# Patient Record
Sex: Female | Born: 1955 | Race: White | Hispanic: No | Marital: Married | Smoking: Never smoker
Health system: Southern US, Community
[De-identification: ages and names within clinical notes are randomized; demographics above are authoritative.]

## PROBLEM LIST (undated history)

## (undated) DIAGNOSIS — E079 Disorder of thyroid, unspecified: Secondary | ICD-10-CM

## (undated) DIAGNOSIS — I10 Essential (primary) hypertension: Secondary | ICD-10-CM

## (undated) HISTORY — PX: THYROID SURGERY: SHX805

## (undated) HISTORY — DX: Disorder of thyroid, unspecified: E07.9

## (undated) HISTORY — DX: Essential (primary) hypertension: I10

---

## 1999-10-03 ENCOUNTER — Encounter: Admission: RE | Admit: 1999-10-03 | Discharge: 1999-10-03 | Payer: Self-pay | Admitting: Obstetrics and Gynecology

## 1999-10-03 ENCOUNTER — Encounter: Payer: Self-pay | Admitting: Obstetrics and Gynecology

## 2003-04-14 ENCOUNTER — Encounter: Admission: RE | Admit: 2003-04-14 | Discharge: 2003-04-14 | Payer: Self-pay | Admitting: Family Medicine

## 2003-04-14 ENCOUNTER — Ambulatory Visit (HOSPITAL_COMMUNITY): Admission: RE | Admit: 2003-04-14 | Discharge: 2003-04-14 | Payer: Self-pay | Admitting: Gastroenterology

## 2010-01-31 ENCOUNTER — Encounter (HOSPITAL_COMMUNITY)
Admission: RE | Admit: 2010-01-31 | Discharge: 2010-03-30 | Payer: Self-pay | Source: Home / Self Care | Attending: Internal Medicine | Admitting: Internal Medicine

## 2010-08-16 NOTE — Op Note (Signed)
Mary Bullock, Mary Bullock                       ACCOUNT NO.:  [id number]   MEDICAL RECORD NO.:  [id number]                   PATIENT TYPE:  AMB   LOCATION:  ENDO                                 FACILITY:  [facility]   PHYSICIAN:  James L. Malon Kindle., M.D.          DATE OF BIRTH:  1955-05-27   DATE OF PROCEDURE:  04/14/2003  DATE OF DISCHARGE:                                 OPERATIVE REPORT   PROCEDURE:  Esophagogastroduodenoscopy and removal of meat impaction.   MEDICATIONS:  Fentanyl 100 mcg, Versed 10 mg IV.   INDICATIONS FOR PROCEDURE:  The patient was eating steak last night and has  been unable to swallow since. A barium swallow done as an outpatient showed  complete obstruction of the esophagus.  The procedure is done to remove the  presumed meat impaction.  It was discussed in detail with the patient prior  to the procedure including the risks and benefits, possibility of  perforation, fever, aspiration, etc.   DESCRIPTION OF PROCEDURE:  The procedure had been explained to the patient  and consent obtained.  With the patient in the left lateral decubitus  position, the Olympus scope was inserted.  A large amount of saliva and  barium was seen in the esophagus.  This was sucked out.  She had a large  meat impaction wedged in the distal esophagus.  Multiple maneuvers were  tried, it had been in there overnight and it was partially decomposed and  kept breaking off.  After failing with a basket and tripod, the only thing  that really would work was to break this apart piece by piece with the  tripod and then use the polyp retrieval basket to collect these pieces and  remove them. This was done 7 or 8 times.  At this point, we whittled  approximately half of the impaction away and were able to get the tripod  around the rest and reach solid material that would accept the graspers. We  were able to pull it out.  The scope was then reinserted and advanced. The  distal esophagus was  examined. There really was very little evidence of  friability, no gross stricture.  The scope easily passed in the stomach, a  complete endoscopy was performed. The duodenal bulb and pyloric channel were  normal. The fundus and cardia were seen well and were normal. No gross  evidence of esophageal stricture, tumor, etc.  There were no more meat  fragments left upon removing the scope.  The patient tolerated the procedure  reasonably well and was awake and alert at the termination of the procedure  with no complaints of shortness of breath, chest pain, etc.   ASSESSMENT:  Meat impaction of the distal esophagus removed.   PLAN:  Will keep the patient on clear liquids today. Will call for pain,  fever, continued coughing, etc.  Tomorrow she can eat what she wants.  James L. Malon Kindle., M.D.   Waldron Session  D:  04/14/2003  T:  04/14/2003  Job:  [id number]   cc:   Caryn Bee L. Little, M.D.  [address]  [place]  [place] [address]  Fax: [phone]

## 2010-08-19 ENCOUNTER — Other Ambulatory Visit: Payer: Self-pay | Admitting: Family Medicine

## 2010-08-19 DIAGNOSIS — Z1231 Encounter for screening mammogram for malignant neoplasm of breast: Secondary | ICD-10-CM

## 2010-08-28 ENCOUNTER — Ambulatory Visit
Admission: RE | Admit: 2010-08-28 | Discharge: 2010-08-28 | Disposition: A | Discharge status: Home / Self Care | Payer: BC Managed Care – PPO | Source: Ambulatory Visit | Attending: Family Medicine | Admitting: Family Medicine

## 2010-08-28 ENCOUNTER — Other Ambulatory Visit: Payer: Self-pay | Admitting: Family Medicine

## 2010-08-28 DIAGNOSIS — Z1231 Encounter for screening mammogram for malignant neoplasm of breast: Secondary | ICD-10-CM

## 2010-08-30 ENCOUNTER — Other Ambulatory Visit: Payer: Self-pay | Admitting: Family Medicine

## 2010-08-30 DIAGNOSIS — R928 Other abnormal and inconclusive findings on diagnostic imaging of breast: Secondary | ICD-10-CM

## 2010-09-05 ENCOUNTER — Ambulatory Visit
Admission: RE | Admit: 2010-09-05 | Discharge: 2010-09-05 | Disposition: A | Discharge status: Home / Self Care | Payer: BC Managed Care – PPO | Source: Ambulatory Visit | Attending: Family Medicine | Admitting: Family Medicine

## 2010-09-05 DIAGNOSIS — R928 Other abnormal and inconclusive findings on diagnostic imaging of breast: Secondary | ICD-10-CM

## 2011-01-29 ENCOUNTER — Other Ambulatory Visit: Payer: Self-pay | Admitting: Family Medicine

## 2011-01-29 DIAGNOSIS — R921 Mammographic calcification found on diagnostic imaging of breast: Secondary | ICD-10-CM

## 2011-03-03 ENCOUNTER — Ambulatory Visit
Admission: RE | Admit: 2011-03-03 | Discharge: 2011-03-03 | Disposition: A | Discharge status: Home / Self Care | Payer: BC Managed Care – PPO | Source: Ambulatory Visit | Attending: Family Medicine | Admitting: Family Medicine

## 2011-03-03 DIAGNOSIS — R921 Mammographic calcification found on diagnostic imaging of breast: Secondary | ICD-10-CM

## 2011-04-23 ENCOUNTER — Other Ambulatory Visit: Payer: Self-pay | Admitting: Family Medicine

## 2011-04-23 ENCOUNTER — Other Ambulatory Visit (HOSPITAL_COMMUNITY)
Admission: RE | Admit: 2011-04-23 | Discharge: 2011-04-23 | Disposition: A | Discharge status: Home / Self Care | Payer: BC Managed Care – PPO | Source: Ambulatory Visit | Attending: Family Medicine | Admitting: Family Medicine

## 2011-04-23 DIAGNOSIS — Z1159 Encounter for screening for other viral diseases: Secondary | ICD-10-CM | POA: Insufficient documentation

## 2011-04-23 DIAGNOSIS — Z124 Encounter for screening for malignant neoplasm of cervix: Secondary | ICD-10-CM | POA: Insufficient documentation

## 2011-04-28 ENCOUNTER — Other Ambulatory Visit: Payer: Self-pay | Admitting: Dermatology

## 2011-05-26 ENCOUNTER — Other Ambulatory Visit: Payer: Self-pay | Admitting: Dermatology

## 2011-06-20 ENCOUNTER — Other Ambulatory Visit: Payer: Self-pay | Admitting: Family Medicine

## 2011-06-20 DIAGNOSIS — I10 Essential (primary) hypertension: Secondary | ICD-10-CM

## 2011-06-23 ENCOUNTER — Other Ambulatory Visit: Payer: Self-pay | Admitting: Family Medicine

## 2011-06-23 DIAGNOSIS — R921 Mammographic calcification found on diagnostic imaging of breast: Secondary | ICD-10-CM

## 2011-06-26 ENCOUNTER — Ambulatory Visit
Admission: RE | Admit: 2011-06-26 | Discharge: 2011-06-26 | Disposition: A | Discharge status: Home / Self Care | Payer: BC Managed Care – PPO | Source: Ambulatory Visit | Attending: Family Medicine | Admitting: Family Medicine

## 2011-06-26 DIAGNOSIS — I10 Essential (primary) hypertension: Secondary | ICD-10-CM

## 2011-09-16 ENCOUNTER — Ambulatory Visit
Admission: RE | Admit: 2011-09-16 | Discharge: 2011-09-16 | Disposition: A | Discharge status: Home / Self Care | Payer: BC Managed Care – PPO | Source: Ambulatory Visit | Attending: Family Medicine | Admitting: Family Medicine

## 2011-09-16 DIAGNOSIS — R921 Mammographic calcification found on diagnostic imaging of breast: Secondary | ICD-10-CM

## 2011-11-21 ENCOUNTER — Other Ambulatory Visit: Payer: Self-pay | Admitting: Dermatology

## 2012-02-06 ENCOUNTER — Other Ambulatory Visit: Payer: Self-pay | Admitting: Family Medicine

## 2012-02-06 DIAGNOSIS — R921 Mammographic calcification found on diagnostic imaging of breast: Secondary | ICD-10-CM

## 2012-03-18 ENCOUNTER — Ambulatory Visit
Admission: RE | Admit: 2012-03-18 | Discharge: 2012-03-18 | Disposition: A | Discharge status: Home / Self Care | Payer: BC Managed Care – PPO | Source: Ambulatory Visit | Attending: Family Medicine | Admitting: Family Medicine

## 2012-03-18 DIAGNOSIS — R921 Mammographic calcification found on diagnostic imaging of breast: Secondary | ICD-10-CM

## 2012-08-09 ENCOUNTER — Other Ambulatory Visit: Payer: Self-pay | Admitting: Family Medicine

## 2012-08-09 DIAGNOSIS — R921 Mammographic calcification found on diagnostic imaging of breast: Secondary | ICD-10-CM

## 2012-09-17 ENCOUNTER — Ambulatory Visit
Admission: RE | Admit: 2012-09-17 | Discharge: 2012-09-17 | Disposition: A | Discharge status: Home / Self Care | Payer: BC Managed Care – PPO | Source: Ambulatory Visit | Attending: Family Medicine | Admitting: Family Medicine

## 2012-09-17 DIAGNOSIS — R921 Mammographic calcification found on diagnostic imaging of breast: Secondary | ICD-10-CM

## 2013-11-01 ENCOUNTER — Other Ambulatory Visit: Payer: Self-pay

## 2013-11-01 DIAGNOSIS — Z1231 Encounter for screening mammogram for malignant neoplasm of breast: Secondary | ICD-10-CM

## 2013-11-08 ENCOUNTER — Ambulatory Visit: Payer: BC Managed Care – PPO

## 2013-11-09 ENCOUNTER — Ambulatory Visit
Admission: RE | Admit: 2013-11-09 | Discharge: 2013-11-09 | Disposition: A | Discharge status: Home / Self Care | Payer: BC Managed Care – PPO | Source: Ambulatory Visit

## 2013-11-09 DIAGNOSIS — Z1231 Encounter for screening mammogram for malignant neoplasm of breast: Secondary | ICD-10-CM

## 2013-11-29 ENCOUNTER — Other Ambulatory Visit: Payer: Self-pay | Admitting: Dermatology

## 2014-06-29 ENCOUNTER — Encounter: Payer: Self-pay | Admitting: Family Medicine

## 2014-08-21 ENCOUNTER — Encounter: Payer: Self-pay | Admitting: Internal Medicine

## 2014-09-26 ENCOUNTER — Ambulatory Visit (AMBULATORY_SURGERY_CENTER): Payer: Self-pay

## 2014-09-26 VITALS — Ht 61.5 in | Wt 131.4 lb

## 2014-09-26 DIAGNOSIS — Z1211 Encounter for screening for malignant neoplasm of colon: Secondary | ICD-10-CM

## 2014-09-26 MED ORDER — MOVIPREP 100 G PO SOLR: 1.0000 | Freq: Once | ORAL | Status: DC

## 2014-09-26 NOTE — Progress Notes (Signed)
No allergies to eggs or soy No diet/weight loss meds No home oxygen No past problems with anesthesia  Has email  Emmi instructions given for colonoscopy 

## 2014-10-10 ENCOUNTER — Ambulatory Visit (AMBULATORY_SURGERY_CENTER): Payer: BLUE CROSS/BLUE SHIELD | Admitting: Internal Medicine

## 2014-10-10 ENCOUNTER — Encounter: Payer: Self-pay | Admitting: Internal Medicine

## 2014-10-10 VITALS — BP 141/65 | HR 48 | Temp 98.7°F | Resp 22 | Ht 61.0 in | Wt 131.0 lb

## 2014-10-10 DIAGNOSIS — D122 Benign neoplasm of ascending colon: Secondary | ICD-10-CM

## 2014-10-10 DIAGNOSIS — Z1211 Encounter for screening for malignant neoplasm of colon: Secondary | ICD-10-CM | POA: Diagnosis not present

## 2014-10-10 DIAGNOSIS — D125 Benign neoplasm of sigmoid colon: Secondary | ICD-10-CM

## 2014-10-10 MED ORDER — SODIUM CHLORIDE 0.9 % IV SOLN: 500.0000 mL | INTRAVENOUS | Status: DC

## 2014-10-10 NOTE — Progress Notes (Signed)
A/ox3 pleased with MAC, report to Celia RN 

## 2014-10-10 NOTE — Op Note (Signed)
[facility]  [address] [place], [address]   COLONOSCOPY PROCEDURE REPORT  PATIENT: Mary Bullock, Mary Bullock  MR#: [id number] BIRTHDATE: [birth year] , 58  yrs. old GENDER: female ENDOSCOPIST: Eustace Quail, MD REFERRED GO:TLXBW Little, M.D. PROCEDURE DATE:  10/10/2014 PROCEDURE:   Colonoscopy, screening and Colonoscopy with snare polypectomy x 2 First Screening Colonoscopy - Avg.  risk and is 50 yrs.  old or older Yes.  Prior Negative Screening - Now for repeat screening. N/A  History of Adenoma - Now for follow-up colonoscopy & has been > or = to 3 yrs.  N/A  Polyps removed today? Yes ASA CLASS:   Class II INDICATIONS:Screening for colonic neoplasia and Colorectal Neoplasm Risk Assessment for this procedure is average risk. MEDICATIONS: Monitored anesthesia care and Propofol 200 mg IV  DESCRIPTION OF PROCEDURE:   After the risks benefits and alternatives of the procedure were thoroughly explained, informed consent was obtained.  The digital rectal exam revealed no abnormalities of the rectum.   The LB [id number]  endoscope was introduced through the anus and advanced to the cecum, which was identified by both the appendix and ileocecal valve. No adverse events experienced.   The quality of the prep was excellent. (MoviPrep was used)  The instrument was then slowly withdrawn as the colon was fully examined. Estimated blood loss is zero unless otherwise noted in this procedure report.    COLON FINDINGS: Two polyps measuring 3 mm in size were found in the sigmoid colon and ascending colon.  A polypectomy was performed with a cold snare.  The resection was complete, the polyp tissue was completely retrieved and sent to histology.   The examination was otherwise normal.  Retroflexed views revealed internal hemorrhoids. The time to cecum = 2.6 Withdrawal time = 12.0   The scope was withdrawn and the procedure completed. COMPLICATIONS: There were no immediate  complications.  ENDOSCOPIC IMPRESSION: 1.   Two polyps were found in the sigmoid colon and ascending colon; polypectomy was performed with a cold snare 2.   The examination was otherwise normal  RECOMMENDATIONS: 1. Repeat colonoscopy in 5 years if polyp adenomatous; otherwise 10 years  eSigned:  Eustace Quail, MD 10/10/2014 2:41 PM   cc: The Patient and Hulan Fess, MD

## 2014-10-10 NOTE — Progress Notes (Signed)
Called to room to assist during endoscopic procedure.  Patient ID and intended procedure confirmed with present staff. Received instructions for my participation in the procedure from the performing physician.  

## 2014-10-10 NOTE — Patient Instructions (Signed)
Discharge instructions given. Handout on polyps. Resume previous medications. YOU HAD AN ENDOSCOPIC PROCEDURE TODAY AT THE [facility]:   Refer to the procedure report that was given to you for any specific questions about what was found during the examination.  If the procedure report does not answer your questions, please call your gastroenterologist to clarify.  If you requested that your care partner not be given the details of your procedure findings, then the procedure report has been included in a sealed envelope for you to review at your convenience later.  YOU SHOULD EXPECT: Some feelings of bloating in the abdomen. Passage of more gas than usual.  Walking can help get rid of the air that was put into your GI tract during the procedure and reduce the bloating. If you had a lower endoscopy (such as a colonoscopy or flexible sigmoidoscopy) you may notice spotting of blood in your stool or on the toilet paper. If you underwent a bowel prep for your procedure, you may not have a normal bowel movement for a few days.  Please Note:  You might notice some irritation and congestion in your nose or some drainage.  This is from the oxygen used during your procedure.  There is no need for concern and it should clear up in a day or so.  SYMPTOMS TO REPORT IMMEDIATELY:   Following lower endoscopy (colonoscopy or flexible sigmoidoscopy):  Excessive amounts of blood in the stool  Significant tenderness or worsening of abdominal pains  Swelling of the abdomen that is new, acute  Fever of 100F or higher   For urgent or emergent issues, a gastroenterologist can be reached at any hour by calling [phone].   DIET: Your first meal following the procedure should be a small meal and then it is ok to progress to your normal diet. Heavy or fried foods are harder to digest and may make you feel nauseous or bloated.  Likewise, meals heavy in dairy and vegetables can increase bloating.  Drink  plenty of fluids but you should avoid alcoholic beverages for 24 hours.  ACTIVITY:  You should plan to take it easy for the rest of today and you should NOT DRIVE or use heavy machinery until tomorrow (because of the sedation medicines used during the test).    FOLLOW UP: Our staff will call the number listed on your records the next business day following your procedure to check on you and address any questions or concerns that you may have regarding the information given to you following your procedure. If we do not reach you, we will leave a message.  However, if you are feeling well and you are not experiencing any problems, there is no need to return our call.  We will assume that you have returned to your regular daily activities without incident.  If any biopsies were taken you will be contacted by phone or by letter within the next 1-3 weeks.  Please call us at [phone] if you have not heard about the biopsies in 3 weeks.    SIGNATURES/CONFIDENTIALITY: You and/or your care partner have signed paperwork which will be entered into your electronic medical record.  These signatures attest to the fact that that the information above on your After Visit Summary has been reviewed and is understood.  Full responsibility of the confidentiality of this discharge information lies with you and/or your care-partner. 

## 2014-10-11 ENCOUNTER — Telehealth: Payer: Self-pay

## 2014-10-11 NOTE — Telephone Encounter (Signed)
  Follow up Call-  Call back number 10/10/2014  Post procedure Call Back phone  # [phone], [phone]  Permission to leave phone message Yes     Patient questions:  Do you have a fever, pain , or abdominal swelling? No. Pain Score  0 *  Have you tolerated food without any problems? Yes.    Have you been able to return to your normal activities? Yes.    Do you have any questions about your discharge instructions: Diet   No. Medications  No. Follow up visit  No.  Do you have questions or concerns about your Care? No.  Actions: * If pain score is 4 or above: No action needed, pain <4.

## 2014-10-16 ENCOUNTER — Encounter: Payer: Self-pay | Admitting: Internal Medicine

## 2015-06-11 ENCOUNTER — Other Ambulatory Visit: Payer: Self-pay | Admitting: Family Medicine

## 2015-06-11 ENCOUNTER — Other Ambulatory Visit (HOSPITAL_COMMUNITY)
Admission: RE | Admit: 2015-06-11 | Discharge: 2015-06-11 | Disposition: A | Discharge status: Home / Self Care | Payer: BLUE CROSS/BLUE SHIELD | Source: Ambulatory Visit | Attending: Family Medicine | Admitting: Family Medicine

## 2015-06-11 DIAGNOSIS — Z124 Encounter for screening for malignant neoplasm of cervix: Secondary | ICD-10-CM | POA: Insufficient documentation

## 2015-06-11 DIAGNOSIS — Z1151 Encounter for screening for human papillomavirus (HPV): Secondary | ICD-10-CM | POA: Diagnosis present

## 2015-06-13 LAB — CYTOLOGY - PAP

## 2015-12-05 DIAGNOSIS — C44712 Basal cell carcinoma of skin of right lower limb, including hip: Secondary | ICD-10-CM | POA: Diagnosis not present

## 2015-12-05 DIAGNOSIS — Z85828 Personal history of other malignant neoplasm of skin: Secondary | ICD-10-CM | POA: Diagnosis not present

## 2015-12-05 DIAGNOSIS — D1801 Hemangioma of skin and subcutaneous tissue: Secondary | ICD-10-CM | POA: Diagnosis not present

## 2015-12-05 DIAGNOSIS — D2271 Melanocytic nevi of right lower limb, including hip: Secondary | ICD-10-CM | POA: Diagnosis not present

## 2015-12-05 DIAGNOSIS — D225 Melanocytic nevi of trunk: Secondary | ICD-10-CM | POA: Diagnosis not present

## 2016-06-11 DIAGNOSIS — Z0000 Encounter for general adult medical examination without abnormal findings: Secondary | ICD-10-CM | POA: Diagnosis not present

## 2016-06-12 DIAGNOSIS — Z0000 Encounter for general adult medical examination without abnormal findings: Secondary | ICD-10-CM | POA: Diagnosis not present

## 2016-07-14 DIAGNOSIS — L7211 Pilar cyst: Secondary | ICD-10-CM | POA: Diagnosis not present

## 2016-11-17 ENCOUNTER — Other Ambulatory Visit: Payer: Self-pay | Admitting: Family Medicine

## 2016-11-17 DIAGNOSIS — Z1231 Encounter for screening mammogram for malignant neoplasm of breast: Secondary | ICD-10-CM

## 2016-11-20 ENCOUNTER — Ambulatory Visit
Admission: RE | Admit: 2016-11-20 | Discharge: 2016-11-20 | Disposition: A | Discharge status: Home / Self Care | Payer: BLUE CROSS/BLUE SHIELD | Source: Ambulatory Visit | Attending: Family Medicine | Admitting: Family Medicine

## 2016-11-20 DIAGNOSIS — Z1231 Encounter for screening mammogram for malignant neoplasm of breast: Secondary | ICD-10-CM

## 2016-12-04 DIAGNOSIS — D225 Melanocytic nevi of trunk: Secondary | ICD-10-CM | POA: Diagnosis not present

## 2016-12-04 DIAGNOSIS — L820 Inflamed seborrheic keratosis: Secondary | ICD-10-CM | POA: Diagnosis not present

## 2016-12-04 DIAGNOSIS — Z85828 Personal history of other malignant neoplasm of skin: Secondary | ICD-10-CM | POA: Diagnosis not present

## 2016-12-04 DIAGNOSIS — D485 Neoplasm of uncertain behavior of skin: Secondary | ICD-10-CM | POA: Diagnosis not present

## 2016-12-04 DIAGNOSIS — D1801 Hemangioma of skin and subcutaneous tissue: Secondary | ICD-10-CM | POA: Diagnosis not present

## 2016-12-04 DIAGNOSIS — D2261 Melanocytic nevi of right upper limb, including shoulder: Secondary | ICD-10-CM | POA: Diagnosis not present

## 2017-06-18 DIAGNOSIS — R8290 Unspecified abnormal findings in urine: Secondary | ICD-10-CM | POA: Diagnosis not present

## 2017-06-18 DIAGNOSIS — E890 Postprocedural hypothyroidism: Secondary | ICD-10-CM | POA: Diagnosis not present

## 2017-06-18 DIAGNOSIS — Z0000 Encounter for general adult medical examination without abnormal findings: Secondary | ICD-10-CM | POA: Diagnosis not present

## 2017-06-18 DIAGNOSIS — E785 Hyperlipidemia, unspecified: Secondary | ICD-10-CM | POA: Diagnosis not present

## 2017-10-12 ENCOUNTER — Other Ambulatory Visit: Payer: Self-pay | Admitting: Family Medicine

## 2017-10-12 DIAGNOSIS — Z1231 Encounter for screening mammogram for malignant neoplasm of breast: Secondary | ICD-10-CM

## 2017-12-01 ENCOUNTER — Ambulatory Visit: Payer: BLUE CROSS/BLUE SHIELD

## 2017-12-04 ENCOUNTER — Ambulatory Visit
Admission: RE | Admit: 2017-12-04 | Discharge: 2017-12-04 | Disposition: A | Discharge status: Home / Self Care | Payer: BLUE CROSS/BLUE SHIELD | Source: Ambulatory Visit | Attending: Family Medicine | Admitting: Family Medicine

## 2017-12-04 DIAGNOSIS — Z1231 Encounter for screening mammogram for malignant neoplasm of breast: Secondary | ICD-10-CM | POA: Diagnosis not present

## 2017-12-07 DIAGNOSIS — D225 Melanocytic nevi of trunk: Secondary | ICD-10-CM | POA: Diagnosis not present

## 2017-12-07 DIAGNOSIS — Z85828 Personal history of other malignant neoplasm of skin: Secondary | ICD-10-CM | POA: Diagnosis not present

## 2017-12-07 DIAGNOSIS — D0371 Melanoma in situ of right lower limb, including hip: Secondary | ICD-10-CM | POA: Diagnosis not present

## 2017-12-07 DIAGNOSIS — D1801 Hemangioma of skin and subcutaneous tissue: Secondary | ICD-10-CM | POA: Diagnosis not present

## 2017-12-07 DIAGNOSIS — D2261 Melanocytic nevi of right upper limb, including shoulder: Secondary | ICD-10-CM | POA: Diagnosis not present

## 2017-12-29 DIAGNOSIS — D0371 Melanoma in situ of right lower limb, including hip: Secondary | ICD-10-CM | POA: Diagnosis not present

## 2017-12-29 DIAGNOSIS — L988 Other specified disorders of the skin and subcutaneous tissue: Secondary | ICD-10-CM | POA: Diagnosis not present

## 2018-06-21 ENCOUNTER — Other Ambulatory Visit: Payer: Self-pay | Admitting: Family Medicine

## 2018-06-21 ENCOUNTER — Other Ambulatory Visit (HOSPITAL_COMMUNITY)
Admission: RE | Admit: 2018-06-21 | Discharge: 2018-06-21 | Disposition: A | Discharge status: Home / Self Care | Payer: BLUE CROSS/BLUE SHIELD | Source: Ambulatory Visit | Attending: Family Medicine | Admitting: Family Medicine

## 2018-06-21 DIAGNOSIS — Z0000 Encounter for general adult medical examination without abnormal findings: Secondary | ICD-10-CM | POA: Diagnosis not present

## 2018-06-21 DIAGNOSIS — I10 Essential (primary) hypertension: Secondary | ICD-10-CM | POA: Diagnosis not present

## 2018-06-21 DIAGNOSIS — E890 Postprocedural hypothyroidism: Secondary | ICD-10-CM | POA: Diagnosis not present

## 2018-06-21 DIAGNOSIS — Z124 Encounter for screening for malignant neoplasm of cervix: Secondary | ICD-10-CM | POA: Diagnosis not present

## 2018-06-23 LAB — CYTOLOGY - PAP
Diagnosis: NEGATIVE
HPV: NOT DETECTED

## 2018-07-20 DIAGNOSIS — Z85828 Personal history of other malignant neoplasm of skin: Secondary | ICD-10-CM | POA: Diagnosis not present

## 2018-07-20 DIAGNOSIS — D485 Neoplasm of uncertain behavior of skin: Secondary | ICD-10-CM | POA: Diagnosis not present

## 2018-07-20 DIAGNOSIS — D1801 Hemangioma of skin and subcutaneous tissue: Secondary | ICD-10-CM | POA: Diagnosis not present

## 2018-07-20 DIAGNOSIS — Z85820 Personal history of malignant melanoma of skin: Secondary | ICD-10-CM | POA: Diagnosis not present

## 2018-10-12 DIAGNOSIS — H5203 Hypermetropia, bilateral: Secondary | ICD-10-CM | POA: Diagnosis not present

## 2018-11-25 ENCOUNTER — Other Ambulatory Visit: Payer: Self-pay | Admitting: Family Medicine

## 2018-11-25 DIAGNOSIS — Z1231 Encounter for screening mammogram for malignant neoplasm of breast: Secondary | ICD-10-CM

## 2018-12-09 DIAGNOSIS — D2261 Melanocytic nevi of right upper limb, including shoulder: Secondary | ICD-10-CM | POA: Diagnosis not present

## 2018-12-09 DIAGNOSIS — Z85828 Personal history of other malignant neoplasm of skin: Secondary | ICD-10-CM | POA: Diagnosis not present

## 2018-12-09 DIAGNOSIS — D225 Melanocytic nevi of trunk: Secondary | ICD-10-CM | POA: Diagnosis not present

## 2018-12-09 DIAGNOSIS — D2262 Melanocytic nevi of left upper limb, including shoulder: Secondary | ICD-10-CM | POA: Diagnosis not present

## 2019-01-13 ENCOUNTER — Other Ambulatory Visit: Payer: Self-pay

## 2019-01-13 ENCOUNTER — Ambulatory Visit
Admission: RE | Admit: 2019-01-13 | Discharge: 2019-01-13 | Disposition: A | Discharge status: Home / Self Care | Payer: BC Managed Care – PPO | Source: Ambulatory Visit | Attending: Family Medicine | Admitting: Family Medicine

## 2019-01-13 DIAGNOSIS — Z1231 Encounter for screening mammogram for malignant neoplasm of breast: Secondary | ICD-10-CM

## 2019-04-13 DIAGNOSIS — R05 Cough: Secondary | ICD-10-CM | POA: Diagnosis not present

## 2019-07-13 DIAGNOSIS — R05 Cough: Secondary | ICD-10-CM | POA: Diagnosis not present

## 2019-07-13 DIAGNOSIS — Z0000 Encounter for general adult medical examination without abnormal findings: Secondary | ICD-10-CM | POA: Diagnosis not present

## 2019-07-13 DIAGNOSIS — Z23 Encounter for immunization: Secondary | ICD-10-CM | POA: Diagnosis not present

## 2019-07-13 DIAGNOSIS — E785 Hyperlipidemia, unspecified: Secondary | ICD-10-CM | POA: Diagnosis not present

## 2019-08-25 DIAGNOSIS — E890 Postprocedural hypothyroidism: Secondary | ICD-10-CM | POA: Diagnosis not present

## 2019-11-13 DIAGNOSIS — Z20822 Contact with and (suspected) exposure to covid-19: Secondary | ICD-10-CM | POA: Diagnosis not present

## 2019-12-03 DIAGNOSIS — M7022 Olecranon bursitis, left elbow: Secondary | ICD-10-CM | POA: Diagnosis not present

## 2019-12-07 ENCOUNTER — Other Ambulatory Visit: Payer: Self-pay | Admitting: Family Medicine

## 2019-12-07 DIAGNOSIS — Z0000 Encounter for general adult medical examination without abnormal findings: Secondary | ICD-10-CM

## 2019-12-13 DIAGNOSIS — D2261 Melanocytic nevi of right upper limb, including shoulder: Secondary | ICD-10-CM | POA: Diagnosis not present

## 2019-12-13 DIAGNOSIS — D2262 Melanocytic nevi of left upper limb, including shoulder: Secondary | ICD-10-CM | POA: Diagnosis not present

## 2019-12-13 DIAGNOSIS — Z85828 Personal history of other malignant neoplasm of skin: Secondary | ICD-10-CM | POA: Diagnosis not present

## 2019-12-13 DIAGNOSIS — D225 Melanocytic nevi of trunk: Secondary | ICD-10-CM | POA: Diagnosis not present

## 2020-01-17 ENCOUNTER — Other Ambulatory Visit: Payer: Self-pay

## 2020-01-17 ENCOUNTER — Ambulatory Visit
Admission: RE | Admit: 2020-01-17 | Discharge: 2020-01-17 | Disposition: A | Discharge status: Home / Self Care | Payer: BC Managed Care – PPO | Source: Ambulatory Visit | Attending: Family Medicine | Admitting: Family Medicine

## 2020-01-17 DIAGNOSIS — Z1231 Encounter for screening mammogram for malignant neoplasm of breast: Secondary | ICD-10-CM | POA: Diagnosis not present

## 2020-01-17 DIAGNOSIS — Z0000 Encounter for general adult medical examination without abnormal findings: Secondary | ICD-10-CM

## 2020-08-15 DIAGNOSIS — E890 Postprocedural hypothyroidism: Secondary | ICD-10-CM | POA: Diagnosis not present

## 2020-08-15 DIAGNOSIS — I10 Essential (primary) hypertension: Secondary | ICD-10-CM | POA: Diagnosis not present

## 2020-11-10 IMAGING — MG DIGITAL SCREENING BILAT W/ TOMO W/ CAD
8 series · 9 of 24 positions shown · non-contrast
Comparison: Previous exam(s).

CLINICAL DATA: Screening.

EXAM:
DIGITAL SCREENING BILATERAL MAMMOGRAM WITH TOMO AND CAD

[R MLO synth-2D]
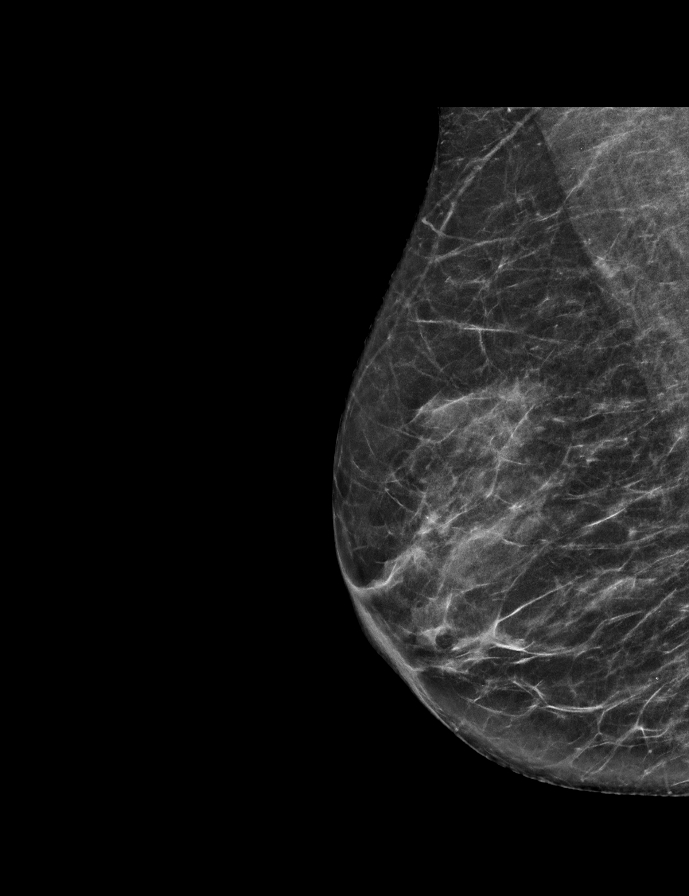

[R CC synth-2D]
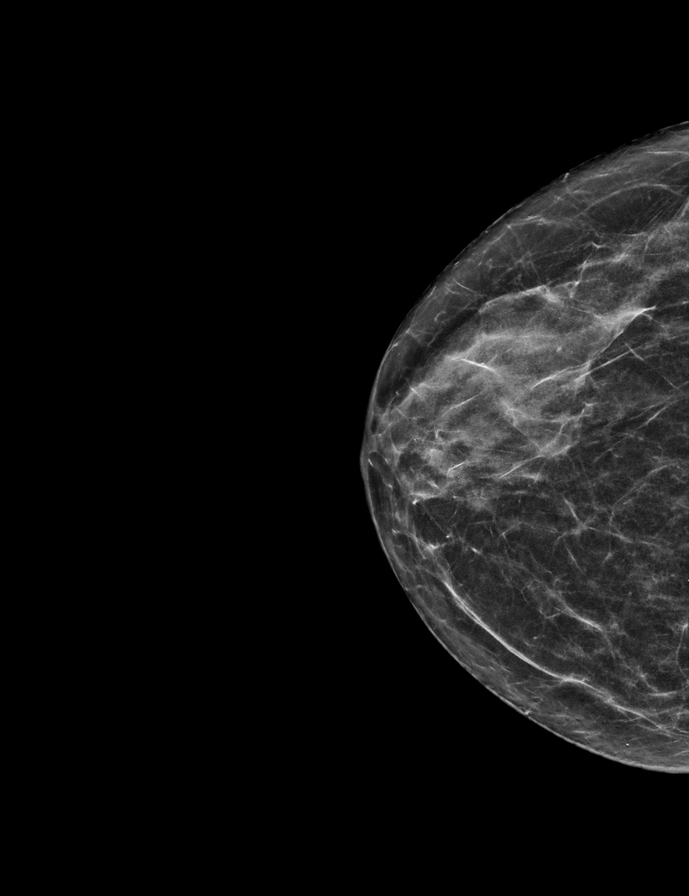

[L CC synth-2D]
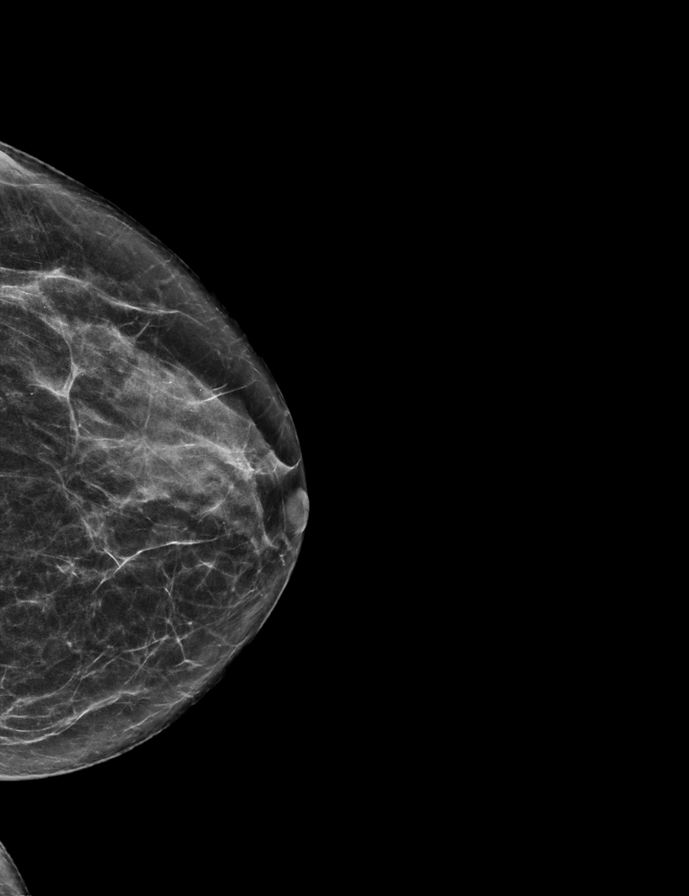

[L MLO synth-2D]
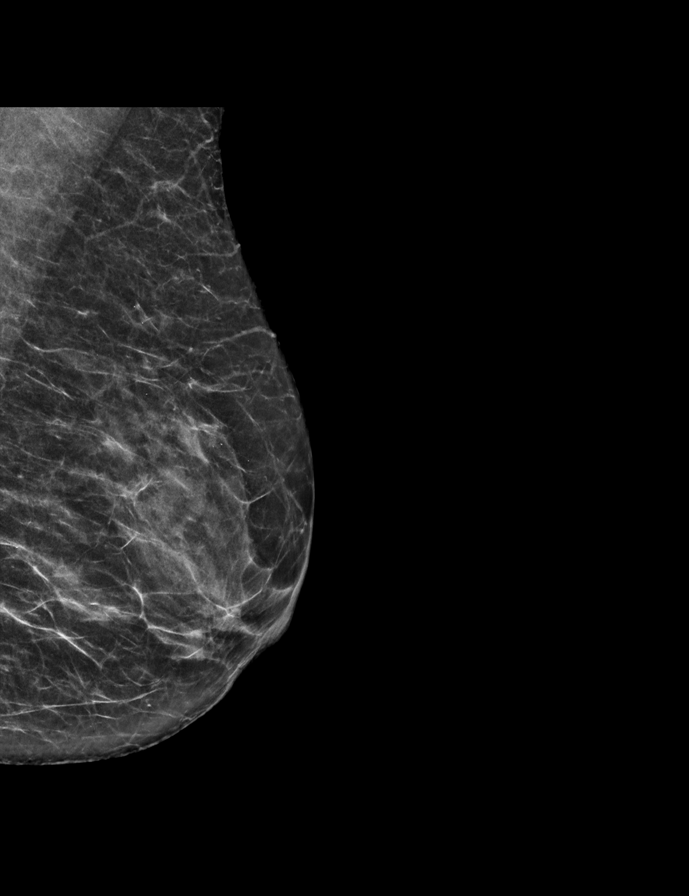

[R CC tomo · 2 of 51 frames shown]
[frame 17/51]
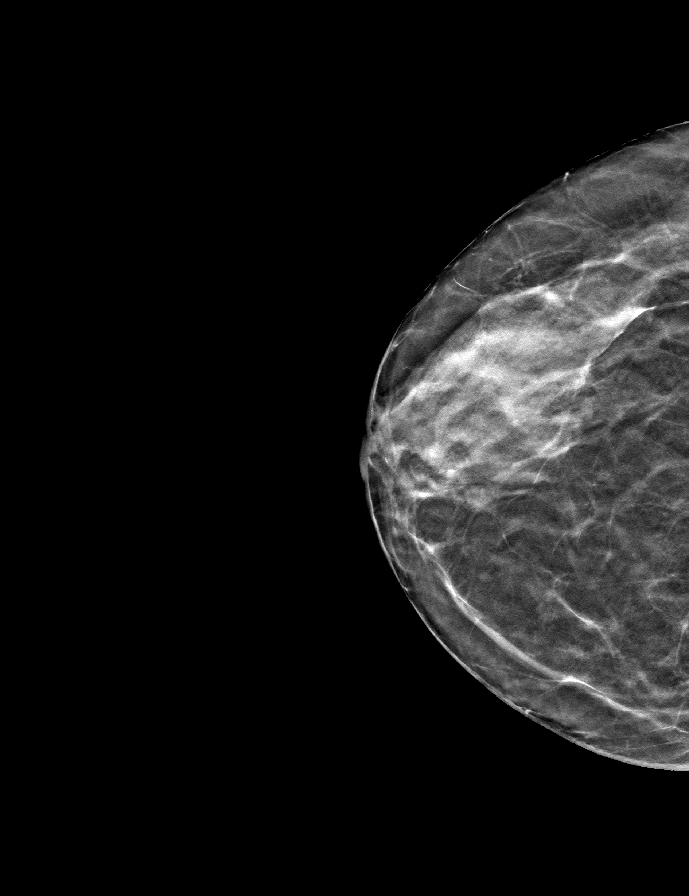
[frame 26/51]
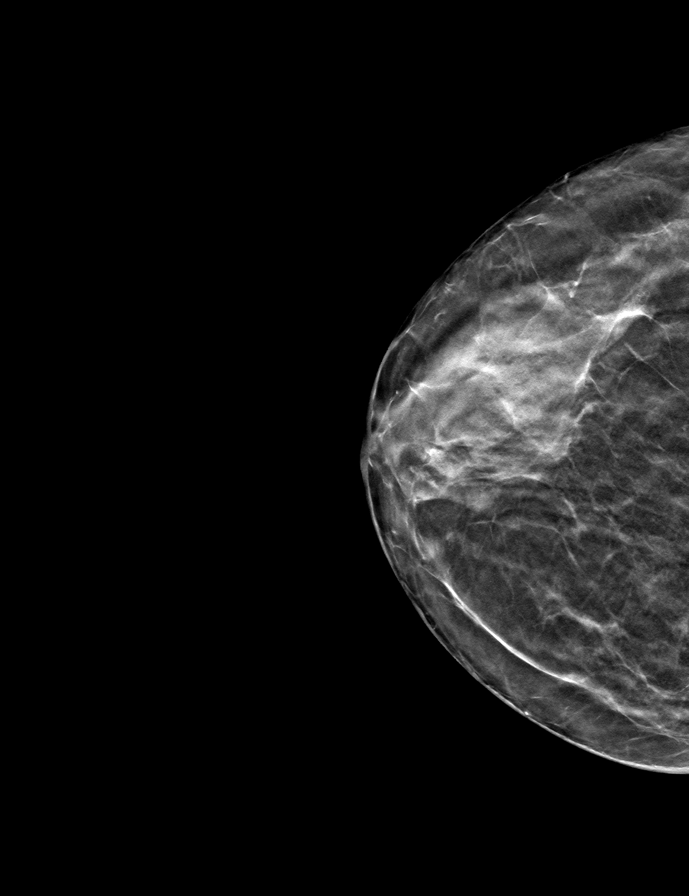

[R MLO tomo · tomo slice 29/56.0]
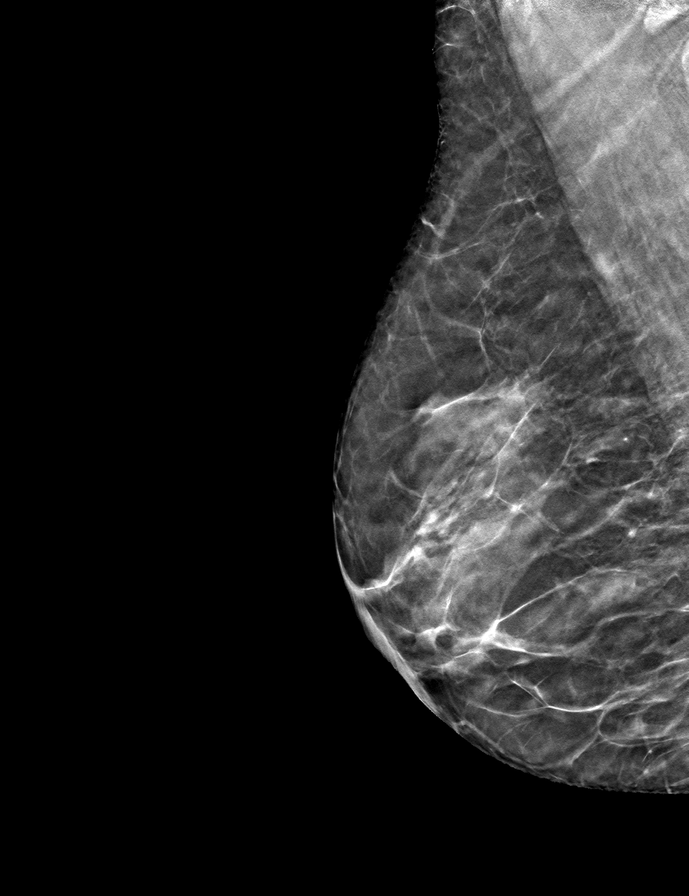

[L CC tomo · tomo slice 28/55.0]
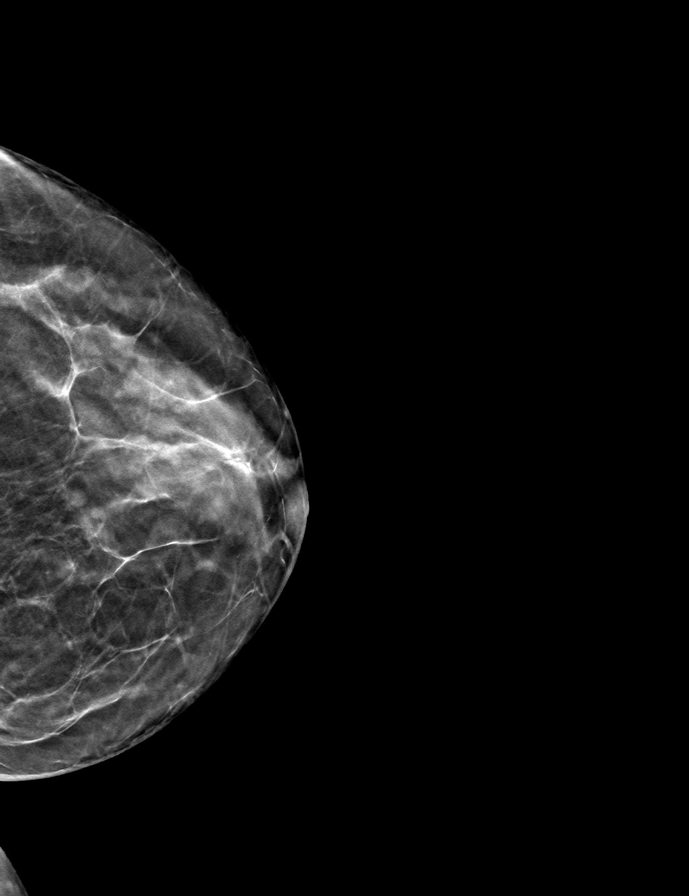

[L MLO tomo · tomo slice 27/54.0]
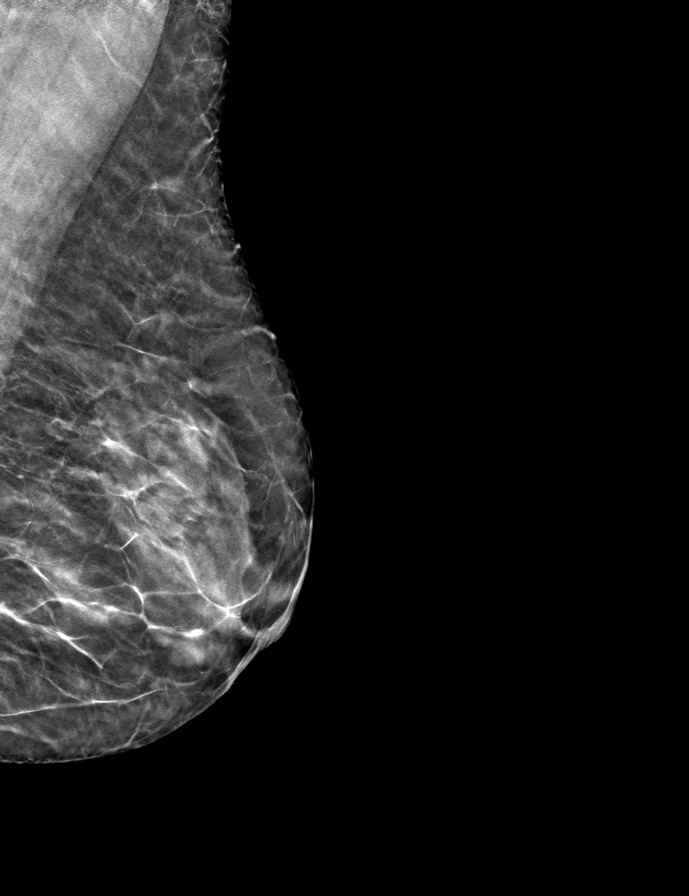

[9 of 24 positions shown; findings below may reference images not displayed]

ACR Breast Density Category c: The breast tissue is heterogeneously
dense, which may obscure small masses.
FINDINGS: There are no findings suspicious for malignancy. Images were
processed with CAD.
IMPRESSION: No mammographic evidence of malignancy. A result letter of this
screening mammogram will be mailed directly to the patient.

RECOMMENDATION:
Screening mammogram in one year. (Code:FT-U-LHB)

BI-RADS CATEGORY  1: Negative.

## 2020-12-07 DIAGNOSIS — E890 Postprocedural hypothyroidism: Secondary | ICD-10-CM | POA: Diagnosis not present

## 2020-12-13 DIAGNOSIS — Z85828 Personal history of other malignant neoplasm of skin: Secondary | ICD-10-CM | POA: Diagnosis not present

## 2020-12-13 DIAGNOSIS — L723 Sebaceous cyst: Secondary | ICD-10-CM | POA: Diagnosis not present

## 2020-12-13 DIAGNOSIS — L821 Other seborrheic keratosis: Secondary | ICD-10-CM | POA: Diagnosis not present

## 2020-12-13 DIAGNOSIS — D1801 Hemangioma of skin and subcutaneous tissue: Secondary | ICD-10-CM | POA: Diagnosis not present

## 2020-12-13 DIAGNOSIS — L570 Actinic keratosis: Secondary | ICD-10-CM | POA: Diagnosis not present

## 2020-12-21 ENCOUNTER — Other Ambulatory Visit: Payer: Self-pay | Admitting: Family Medicine

## 2020-12-21 DIAGNOSIS — Z1231 Encounter for screening mammogram for malignant neoplasm of breast: Secondary | ICD-10-CM

## 2021-01-22 ENCOUNTER — Ambulatory Visit
Admission: RE | Admit: 2021-01-22 | Discharge: 2021-01-22 | Disposition: A | Discharge status: Home / Self Care | Payer: BC Managed Care – PPO | Source: Ambulatory Visit | Attending: Family Medicine | Admitting: Family Medicine

## 2021-01-22 ENCOUNTER — Other Ambulatory Visit: Payer: Self-pay

## 2021-01-22 DIAGNOSIS — Z1231 Encounter for screening mammogram for malignant neoplasm of breast: Secondary | ICD-10-CM | POA: Diagnosis not present

## 2021-03-13 DIAGNOSIS — Z0000 Encounter for general adult medical examination without abnormal findings: Secondary | ICD-10-CM | POA: Diagnosis not present

## 2021-09-12 DIAGNOSIS — I10 Essential (primary) hypertension: Secondary | ICD-10-CM | POA: Diagnosis not present

## 2021-09-12 DIAGNOSIS — E785 Hyperlipidemia, unspecified: Secondary | ICD-10-CM | POA: Diagnosis not present

## 2021-09-12 DIAGNOSIS — E890 Postprocedural hypothyroidism: Secondary | ICD-10-CM | POA: Diagnosis not present

## 2021-09-12 DIAGNOSIS — Z79899 Other long term (current) drug therapy: Secondary | ICD-10-CM | POA: Diagnosis not present

## 2021-09-30 ENCOUNTER — Other Ambulatory Visit: Payer: Self-pay | Admitting: Family Medicine

## 2021-09-30 DIAGNOSIS — Z1231 Encounter for screening mammogram for malignant neoplasm of breast: Secondary | ICD-10-CM

## 2021-10-22 DIAGNOSIS — L255 Unspecified contact dermatitis due to plants, except food: Secondary | ICD-10-CM | POA: Diagnosis not present

## 2021-12-23 DIAGNOSIS — D1801 Hemangioma of skin and subcutaneous tissue: Secondary | ICD-10-CM | POA: Diagnosis not present

## 2021-12-23 DIAGNOSIS — Z85828 Personal history of other malignant neoplasm of skin: Secondary | ICD-10-CM | POA: Diagnosis not present

## 2021-12-23 DIAGNOSIS — L821 Other seborrheic keratosis: Secondary | ICD-10-CM | POA: Diagnosis not present

## 2021-12-23 DIAGNOSIS — D225 Melanocytic nevi of trunk: Secondary | ICD-10-CM | POA: Diagnosis not present

## 2021-12-23 DIAGNOSIS — L814 Other melanin hyperpigmentation: Secondary | ICD-10-CM | POA: Diagnosis not present

## 2021-12-23 DIAGNOSIS — L570 Actinic keratosis: Secondary | ICD-10-CM | POA: Diagnosis not present

## 2022-01-24 ENCOUNTER — Ambulatory Visit
Admission: RE | Admit: 2022-01-24 | Discharge: 2022-01-24 | Disposition: A | Discharge status: Home / Self Care | Payer: BC Managed Care – PPO | Source: Ambulatory Visit | Attending: Family Medicine | Admitting: Family Medicine

## 2022-01-24 DIAGNOSIS — Z1231 Encounter for screening mammogram for malignant neoplasm of breast: Secondary | ICD-10-CM

## 2022-04-04 DIAGNOSIS — Z23 Encounter for immunization: Secondary | ICD-10-CM | POA: Diagnosis not present

## 2022-04-04 DIAGNOSIS — Z0000 Encounter for general adult medical examination without abnormal findings: Secondary | ICD-10-CM | POA: Diagnosis not present

## 2022-04-07 ENCOUNTER — Other Ambulatory Visit: Payer: Self-pay | Admitting: Family Medicine

## 2022-04-07 DIAGNOSIS — E2839 Other primary ovarian failure: Secondary | ICD-10-CM

## 2022-10-01 ENCOUNTER — Other Ambulatory Visit: Payer: BC Managed Care – PPO

## 2022-10-06 ENCOUNTER — Other Ambulatory Visit: Payer: Self-pay | Admitting: Family Medicine

## 2022-10-06 DIAGNOSIS — Z1231 Encounter for screening mammogram for malignant neoplasm of breast: Secondary | ICD-10-CM

## 2022-10-06 DIAGNOSIS — E2839 Other primary ovarian failure: Secondary | ICD-10-CM

## 2022-10-08 DIAGNOSIS — E890 Postprocedural hypothyroidism: Secondary | ICD-10-CM | POA: Diagnosis not present

## 2022-10-08 DIAGNOSIS — I10 Essential (primary) hypertension: Secondary | ICD-10-CM | POA: Diagnosis not present

## 2022-10-08 DIAGNOSIS — E785 Hyperlipidemia, unspecified: Secondary | ICD-10-CM | POA: Diagnosis not present

## 2022-12-29 DIAGNOSIS — L905 Scar conditions and fibrosis of skin: Secondary | ICD-10-CM | POA: Diagnosis not present

## 2022-12-29 DIAGNOSIS — D225 Melanocytic nevi of trunk: Secondary | ICD-10-CM | POA: Diagnosis not present

## 2022-12-29 DIAGNOSIS — Z85828 Personal history of other malignant neoplasm of skin: Secondary | ICD-10-CM | POA: Diagnosis not present

## 2022-12-29 DIAGNOSIS — D2261 Melanocytic nevi of right upper limb, including shoulder: Secondary | ICD-10-CM | POA: Diagnosis not present

## 2023-01-26 ENCOUNTER — Ambulatory Visit
Admission: RE | Admit: 2023-01-26 | Discharge: 2023-01-26 | Disposition: A | Discharge status: Home / Self Care | Payer: BC Managed Care – PPO | Source: Ambulatory Visit | Attending: Family Medicine | Admitting: Family Medicine

## 2023-01-26 DIAGNOSIS — Z1231 Encounter for screening mammogram for malignant neoplasm of breast: Secondary | ICD-10-CM

## 2023-04-03 ENCOUNTER — Other Ambulatory Visit: Payer: BC Managed Care – PPO

## 2023-04-13 ENCOUNTER — Ambulatory Visit
Admission: RE | Admit: 2023-04-13 | Discharge: 2023-04-13 | Disposition: A | Discharge status: Home / Self Care | Payer: BC Managed Care – PPO | Source: Ambulatory Visit | Attending: Family Medicine | Admitting: Family Medicine

## 2023-04-13 DIAGNOSIS — M8588 Other specified disorders of bone density and structure, other site: Secondary | ICD-10-CM | POA: Diagnosis not present

## 2023-04-13 DIAGNOSIS — E2839 Other primary ovarian failure: Secondary | ICD-10-CM

## 2023-04-13 DIAGNOSIS — N958 Other specified menopausal and perimenopausal disorders: Secondary | ICD-10-CM | POA: Diagnosis not present

## 2023-04-22 DIAGNOSIS — Z0000 Encounter for general adult medical examination without abnormal findings: Secondary | ICD-10-CM | POA: Diagnosis not present

## 2023-04-22 DIAGNOSIS — E785 Hyperlipidemia, unspecified: Secondary | ICD-10-CM | POA: Diagnosis not present

## 2023-04-22 DIAGNOSIS — I10 Essential (primary) hypertension: Secondary | ICD-10-CM | POA: Diagnosis not present

## 2023-04-22 DIAGNOSIS — M810 Age-related osteoporosis without current pathological fracture: Secondary | ICD-10-CM | POA: Diagnosis not present

## 2023-04-22 DIAGNOSIS — E890 Postprocedural hypothyroidism: Secondary | ICD-10-CM | POA: Diagnosis not present

## 2023-12-28 ENCOUNTER — Other Ambulatory Visit: Payer: Self-pay | Admitting: Family Medicine

## 2023-12-28 DIAGNOSIS — Z1231 Encounter for screening mammogram for malignant neoplasm of breast: Secondary | ICD-10-CM

## 2023-12-31 DIAGNOSIS — Z85828 Personal history of other malignant neoplasm of skin: Secondary | ICD-10-CM | POA: Diagnosis not present

## 2023-12-31 DIAGNOSIS — D225 Melanocytic nevi of trunk: Secondary | ICD-10-CM | POA: Diagnosis not present

## 2023-12-31 DIAGNOSIS — L821 Other seborrheic keratosis: Secondary | ICD-10-CM | POA: Diagnosis not present

## 2023-12-31 DIAGNOSIS — Z85820 Personal history of malignant melanoma of skin: Secondary | ICD-10-CM | POA: Diagnosis not present

## 2024-01-28 ENCOUNTER — Ambulatory Visit
Admission: RE | Admit: 2024-01-28 | Discharge: 2024-01-28 | Disposition: A | Discharge status: Home / Self Care | Source: Ambulatory Visit | Attending: Family Medicine | Admitting: Family Medicine

## 2024-01-28 DIAGNOSIS — Z1231 Encounter for screening mammogram for malignant neoplasm of breast: Secondary | ICD-10-CM | POA: Diagnosis not present
